# Patient Record
Sex: Male | Born: 2005 | Race: White | Hispanic: Yes | Marital: Single | Smoking: Never smoker
Health system: Southern US, Community
[De-identification: ages and names within clinical notes are randomized; demographics above are authoritative.]

---
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
## 2024-02-14 NOTE — Progress Notes (Signed)
 Almalik Weissberg is a 18 y.o.  with  reports that he has never smoked. He has never used smokeless tobacco. with  Active Ambulatory Problems    Diagnosis Date Noted  . Irritable bowel syndrome with constipation 12/19/2019  . Fecal impaction of colon (HCC) 05/17/2020  . Abnormal auditory perception of both ears 05/31/2020  . 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 No Resolved Ambulatory Problems   Past Medical History:  Diagnosis Date  . Bleeding nose   . Constipation    who presents today at Urgent Care for  Chief Complaint  Patient presents with  . Abdominal Pain    Pt complains of pubic pain and  penile discharge x 2 days  Pt wants to be tested for all STI testing including blood work.   . muffled hearing    Pt noticed his hearing has been muffled for the past 5 days        History of Present Illness This is a 18 year old male presenting with pubic pain and discharge from the penis for about 2 days. He would like STI testing. He also has noticed that his hearing is a little muffled for the past 5 days.  The patient reports a recent sexual encounter approximately 2 weeks ago, which has raised concerns about potential sexually transmitted infections. He describes a milky discharge from his penis and expresses concern about the possibility of gonorrhea or chlamydia. He also mentions an increase in his hearing threshold, which he attributes to sinus pressure. Additionally, he notes that his sexual activity has been more frequent recently, with multiple ejaculations, and wonders if this could have caused irritation leading to the presence of blood in his urine.  The patient reports muffled hearing for the past 5 days. He had a localized injury in his left ear a few years ago and is not concerned about it. He has noticed that his hearing threshold is a little bit higher. He feels as if he has more pressure in his ears,  which he initially thought could be sinus pressure. He is mildly concerned about it. He is hesitant about medical procedures. He once cleaned his ears out and got a little bit of blood, but that was way before this.      Patient has no co-morbidities  or medications that might increase the risk for developing a severe infection     reports that he has never smoked. He has never used smokeless tobacco.  Review of Systems  Review of systems is otherwise negative except as noted in the HPI and Assessment/MDM   Physical Exam  BP 123/68   Pulse 87   Temp 98.6 F (37 C) (Oral)   Resp 18   Ht 1.803 m (5' 11)   Wt 86.2 kg (190 lb)   SpO2 99%   BMI 26.50 kg/m   Constitutional:      General: Patient is not in acute distress.    Appearance: Normal appearance.  Neurological:     General: No focal deficit present.     Mental Status: alert and oriented to person, place, and time.  HENT:     Eyes:     Conjunctiva/sclera: Conjunctivae normal. Pharynx normal    There is no  associated anterior cervical lymphadenopathy    Pupils: Pupils are equal, round, and reactive to light.     TM's normal with no visible effusion  Cardiovascular:     Heart sounds:  Normal heart sounds. No murmur heard.    No Lower extremity edema noted Pulmonary:     Effort: Pulmonary effort is normal. No respiratory distress.     Breath sounds: No wheezing, rhonchi or rales.  Abdominal:     General: Bowel sounds are normal. There is no distension.     Tenderness: There is no abdominal tenderness.  Musculoskeletal:        General: Normal range of motion.     Neck:  No rigidity.  Skin:    General: Skin is warm and dry.  Psychiatric:        Mood and Affect: Mood normal.   No orders to display            DIAGNOSIS/PLAN     1. Otalgia of right ear  Ear Cerumen Removal   ciprofloxacin-dexAMETHasone (CIPRODEX) 0.3-0.1 % otic suspension    2. STI (sexually transmitted infection)  Chlamydia /  Gonococcus (GC), NAAT   Hepatitis C Virus (HCV) Antibody Screen With Confirmation   HIV Screen with Reflex to Confirmation   Rapid Plasma Reagin (RPR), Qualitative Test with Reflex to Titer and Confirmation   Trichomonas vaginalis, Qualitative NAAT   POC Urinalysis Auto without Microscopic      Assessment & Plan Initial Assessment: 18 year old male with pubic pain and penile discharge for 2 days, requesting STI testing. Reports muffled hearing for 5 days.  Differential Diagnosis: - Gonorrhea   - Milky discharge raises suspicion   - Ceftriaxone 500 mg injection discussed   - Advised to abstain from sexual intercourse until results available   - Opted to wait for test results before proceeding with ceftriaxone injection - Chlamydia   - Symptoms align with chlamydia   - Doxycycline prescribed, 100 mg twice daily for 7 days   - Advised to abstain from sexual intercourse until results available  ED Course: - Ear irrigation performed to remove hair - Prescription for eardrops provided  Final Assessment: STI testing requested due to pubic pain and penile discharge. Ear irrigation performed for hair causing inflammation. Prescriptions for doxycycline and eardrops provided.  Clinical Impression: - Suspected gonorrhea - Suspected chlamydia - Ear inflammation  Disposition: - Follow-Up: Return for ceftriaxone injection if test results confirm gonorrhea. Continue doxycycline if chlamydia confirmed.  Patient Education: Advised to abstain from sexual intercourse until STI test results are available. Discussed potential side effects of ceftriaxone and ear irrigation risks.  Portions of this note were created using the aid of voice recognition Dragon/DAX dictation software.   We discussed risks and side effects of medications, and also discussed red flags which would warrant immediate follow-up.   Urgent Care Disposition:  Home Care

## 2024-03-01 ENCOUNTER — Emergency Department (HOSPITAL_BASED_OUTPATIENT_CLINIC_OR_DEPARTMENT_OTHER)
Admission: EM | Admit: 2024-03-01 | Discharge: 2024-03-01 | Disposition: A | Discharge status: Home / Self Care | Attending: Emergency Medicine | Admitting: Emergency Medicine

## 2024-03-01 ENCOUNTER — Other Ambulatory Visit: Payer: Self-pay

## 2024-03-01 ENCOUNTER — Emergency Department (HOSPITAL_BASED_OUTPATIENT_CLINIC_OR_DEPARTMENT_OTHER)

## 2024-03-01 DIAGNOSIS — N503 Cyst of epididymis: Secondary | ICD-10-CM | POA: Insufficient documentation

## 2024-03-01 DIAGNOSIS — N50811 Right testicular pain: Secondary | ICD-10-CM | POA: Insufficient documentation

## 2024-03-01 LAB — URINALYSIS, ROUTINE W REFLEX MICROSCOPIC
Bilirubin Urine: NEGATIVE
Glucose, UA: NEGATIVE mg/dL
Hgb urine dipstick: NEGATIVE
Ketones, ur: NEGATIVE mg/dL
Leukocytes,Ua: NEGATIVE
Nitrite: NEGATIVE
Protein, ur: NEGATIVE mg/dL
Specific Gravity, Urine: 1.021 (ref 1.005–1.030)
pH: 6.5 (ref 5.0–8.0)

## 2024-03-01 NOTE — ED Triage Notes (Signed)
 Pt POV reporting R testicle discomfort and swelling since last night. Ran half marathon on Thanksgiving.

## 2024-03-01 NOTE — ED Notes (Signed)
Patient verbalizes understanding of discharge instructions. Opportunity for questioning and answers were provided. Armband removed by staff, pt discharged from ED. Ambulated out to lobby  

## 2024-03-01 NOTE — ED Notes (Signed)
 Pt c/o pain in the bladder when urinating, states that this has happened to him previously. Paramedic notified.

## 2024-03-01 NOTE — ED Notes (Signed)
 This author assisted EDP with testicular exam.

## 2024-03-01 NOTE — ED Provider Notes (Signed)
 [place] EMERGENCY DEPARTMENT AT [facility] Provider Note   CSN: [id number] Arrival date & time: 03/01/24  1816     Patient presents with: Testicle Pain   Brad Campos is a 18 y.o. male with no pertinent past medical history presents emergency department for evaluation of right testicular pain.  Patient reports he first noticed right testicular pain yesterday and states that it was more elevated than normal.  Patient reports the pain was also present today.  Patient was evaluated urgent care on 11/13 for the same complaint.  At urgent care, his urine was found to have trace amounts of blood.  His urine was otherwise not infected.  Primary differential at that time was GC/chlamydia and treatment with ceftriaxone and doxycycline was discussed, however patient did not have concern for STIs so was not treated.  Patient denies any fever, body aches, chills.  He denies abdominal pain, nausea or vomiting.  Patient reports his pain is localized to the right testicle and does not wrap around to his back or move elsewhere.  Testicle Pain       Prior to Admission medications   Medication Sig Start Date End Date Taking? Authorizing Provider  cephALEXin  (KEFLEX ) 500 MG capsule Take 1 capsule (500 mg total) by mouth 3 (three) times daily. 03/17/21   Gershon Donnice SAUNDERS, DPM  gentamicin  cream (GARAMYCIN ) 0.1 % Apply 1 application topically 2 (two) times daily. 02/09/21   Janit Thresa HERO, DPM  lactulose  (CHRONULAC ) 10 GM/15ML solution Take 30 mLs (20 g total) by mouth in the morning and at bedtime. 10/12/19   Petrucelli, Samantha R, PA-C    Allergies: Patient has no known allergies.    Review of Systems  Genitourinary:  Positive for testicular pain.    Updated Vital Signs BP 129/82   Pulse 76   Temp 97.7 F (36.5 C) (Oral)   Resp 17   Ht 5' 11 (1.803 m)   Wt 86.2 kg   SpO2 100%   BMI 26.50 kg/m   Physical Exam Vitals and nursing note reviewed. Exam conducted with a  chaperone present.  Constitutional:      Appearance: Normal appearance.  HENT:     Head: Normocephalic and atraumatic.     Mouth/Throat:     Mouth: Mucous membranes are moist.  Eyes:     General: No scleral icterus.       Right eye: No discharge.        Left eye: No discharge.     Conjunctiva/sclera: Conjunctivae normal.  Cardiovascular:     Rate and Rhythm: Normal rate and regular rhythm.     Pulses: Normal pulses.  Pulmonary:     Effort: Pulmonary effort is normal.     Breath sounds: Normal breath sounds.  Abdominal:     General: There is no distension.     Tenderness: There is no abdominal tenderness.  Genitourinary:    Penis: Normal and uncircumcised. No discharge.      Testes:        Right: Tenderness present.     Comments: Patient with epididymis tenderness to palpation on the right.  Left testicle is nontender.  Mild erythema noted to the right testicle.  No discharge noted to penile head. Musculoskeletal:        General: No deformity.     Cervical back: Normal range of motion.  Skin:    General: Skin is warm and dry.     Capillary Refill: Capillary refill takes less than 2 seconds.  Neurological:     Mental Status: He is alert.     Motor: No weakness.  Psychiatric:        Mood and Affect: Mood normal.     (all labs ordered are listed, but only abnormal results are displayed) Labs Reviewed  URINALYSIS, ROUTINE W REFLEX MICROSCOPIC  GC/CHLAMYDIA PROBE AMP ([place]) NOT AT [facility]    EKG: None  Radiology: US  SCROTUM W/DOPPLER Result Date: 03/01/2024 CLINICAL DATA:  Right testicle pain x1 day. EXAM: SCROTAL ULTRASOUND DOPPLER ULTRASOUND OF THE TESTICLES TECHNIQUE: Complete ultrasound examination of the testicles, epididymis, and other scrotal structures was performed. Color and spectral Doppler ultrasound were also utilized to evaluate blood flow to the testicles. COMPARISON:  None Available. FINDINGS: Right testicle Measurements: 4.3 cm x 2.4 cm x 2.8 cm. No  mass or microlithiasis visualized. Left testicle Measurements: 4.3 cm x 2.4 cm x 2.7 cm. No mass or microlithiasis visualized. Right epididymis: There is a 1.7 mm x 1.7 mm x 2.2 mm right epididymal head cyst. Left epididymis:  Normal in size and appearance. Hydrocele:  None visualized. Varicocele:  None visualized. Pulsed Doppler interrogation of both testes demonstrates normal low resistance arterial and venous waveforms bilaterally. IMPRESSION: Small right epididymal head cyst. Electronically Signed   By: Suzen Dials M.D.   On: 03/01/2024 19:28    Procedures   Medications Ordered in the ED - No data to display                                  Medical Decision Making Amount and/or Complexity of Data Reviewed Labs: ordered. Radiology: ordered.   This patient presents to the ED for concern of scrotal pain, this involves an extensive number of treatment options, and is a complaint that carries with it a high risk of complications and morbidity.   Differential diagnosis includes: Testicular torsion, GC/chlamydia, UTI, pyelonephritis, epididymitis  Co morbidities:  none  Additional history:  Patient was evaluated urgent care on 11/13 for complaints of abnormal penile discharge and scrotal pain.  His GC/chlamydia test were negative.  He was prescribed prophylactic doxycycline at that time.Unclear whether or not patient actually took this medication.  Lab Tests:  I Ordered, and personally interpreted labs.  The pertinent results include: No acute abnormalities to explain patient's symptoms.   Imaging Studies:  I ordered imaging studies including scrotal ultrasound I independently visualized and interpreted imaging which showed   Small right epididymal head cyst.   I agree with the radiologist interpretation  Cardiac Monitoring/ECG:  The patient was maintained on a cardiac monitor.  I personally viewed and interpreted the cardiac monitored which showed an underlying rhythm of:  Sinus rhythm  Medicines ordered and prescription drug management: I offered the patient pain medicine however he declined.  Test Considered:   none  Critical Interventions:   none  Consultations Obtained: None  Problem List / ED Course:     ICD-10-CM   1. Pain in right testicle  N50.811       MDM: 18 year old male who presents emergency department for evaluation of right testicular pain.  Patient reports the pain started yesterday and has worsened today.  He does report recent sexual activity with a male partner.  Patient was evaluated urgent care on 11/13 for same complaint.  At that time, his UA did show trace amounts of blood.  There was discussion with the urgent care provider about treating prophylactically for  GC/chlamydia as the patient also reported abnormal penile discharge at that time.  However, patient was not treated prophylactically for GC/chlamydia as his results came back negative and the provider opted not to treat him.  I did order a testicular ultrasound to rule out possible torsion.  Ultrasound shows small right epididymal cyst on the head.  This is possibly consistent with a small spermatocele.  Upon further discussion with the patient, he actually states he does not have any concerns for STIs at this time.  He is declining prophylactic treatment in the emergency department today as well.  Given patient's reassuring UA today, I am agreeable to this.  I did provide the patient with a referral to urology and recommended he call to schedule an appointment for further workup.  Patient verbalizes understanding to this and is agreeable.  Patient vital signs are stable.  Patient is appropriate for discharge at this time.  Dispostion:  After consideration of the diagnostic results and the patients response to treatment, I feel that the patient would benefit from supportive care.    Final diagnoses:  Pain in right testicle    ED Discharge Orders     None           Torrence Marry GORMAN DEVONNA 03/02/24 0001    Armenta Canning, MD 03/02/24 1850

## 2024-03-01 NOTE — ED Notes (Signed)
 Assisted US  with testicular exam.

## 2024-03-01 NOTE — Discharge Instructions (Addendum)
 It was a pleasure taking care of you today. You were seen in the Emergency Department for right testicular and epididymis pain. Your work-up was reassuring. Your ultrasound showed no evidence of testicular torsion.  There was mention of a small cyst on the epididymis, which could be consistent with something called a spermatocele.  However, urology would be better equipped to assess and diagnose this.  At this time, you have opted not to receive prophylactic treatment for GC/chlamydia.  I did however provide you with a referral to urology, who you can call to schedule an appointment with if your symptoms worsen. Refer to the attached documentation for further management of your symptoms.   Please return to the ER if you experience chest pain, trouble breathing, intractable nausea/vomiting or any other life threatening illnesses.

## 2024-03-03 LAB — GC/CHLAMYDIA PROBE AMP (~~LOC~~) NOT AT ARMC
Chlamydia: NEGATIVE
Comment: NEGATIVE
Comment: NORMAL
Neisseria Gonorrhea: NEGATIVE

## 2024-04-01 ENCOUNTER — Encounter (HOSPITAL_BASED_OUTPATIENT_CLINIC_OR_DEPARTMENT_OTHER): Payer: Self-pay

## 2024-04-01 ENCOUNTER — Emergency Department (HOSPITAL_BASED_OUTPATIENT_CLINIC_OR_DEPARTMENT_OTHER)

## 2024-04-01 ENCOUNTER — Emergency Department (HOSPITAL_BASED_OUTPATIENT_CLINIC_OR_DEPARTMENT_OTHER)
Admission: EM | Admit: 2024-04-01 | Discharge: 2024-04-01 | Disposition: A | Discharge status: Home / Self Care | Source: Ambulatory Visit

## 2024-04-01 ENCOUNTER — Other Ambulatory Visit: Payer: Self-pay

## 2024-04-01 DIAGNOSIS — R519 Headache, unspecified: Secondary | ICD-10-CM | POA: Diagnosis present

## 2024-04-01 DIAGNOSIS — R4189 Other symptoms and signs involving cognitive functions and awareness: Secondary | ICD-10-CM

## 2024-04-01 DIAGNOSIS — G9389 Other specified disorders of brain: Secondary | ICD-10-CM | POA: Insufficient documentation

## 2024-04-01 MED ORDER — HYDROXYZINE HCL 25 MG PO TABS: 25.0000 mg | ORAL_TABLET | Freq: Three times a day (TID) | ORAL | 0 refills | Status: AC | PRN

## 2024-04-01 NOTE — Progress Notes (Signed)
 SUBJECTIVE:  Brad Campos is a 18 y.o. male with girlfriend.  History of Present Illness This is a male with a history of bronchitis presenting with a headache.  The patient has been experiencing a persistent headache since 03/29/2024. He describes the headache as migratory, affecting various parts of his head, including his nose, the top of his head, and the back of his head. He rates the pain as 4 to 6 out of 10, escalating to an 8 at its peak. The headache has been severe enough to disrupt his cognitive functions, including thinking, speaking, and memory recall. He reports a sensation of heat in his head since 03/31/2024 but did not measure his temperature. He typically does not take medication and considers himself generally healthy. He recalls a similar episode during a previous trip involving altitude changes, which he attributed to the change in elevation. His sleep pattern has been irregular, but it is gradually improving. He reports no numbness or tingling in his extremities, except for facial numbness earlier. He reports no weakness in his arms or legs. He reports no feelings of depression, worthlessness, or suicidal ideation. He has been experiencing a lack of appetite and thirst.  The patient also reports episodes of confusion and brain fog, describing a sensation of detachment from his surroundings. He experienced difficulty swallowing [place] the beach on [place]2024 and had a nosebleed, which he attributes to sun exposure. He has been feeling shaky since his arrival at the clinic and experienced a sensation of the floor moving beneath his feet while in the lobby. He reports no dizziness or blurry vision but mentions difficulty comprehending written text. He reports muffled hearing and right ear pain this morning. He reports no runny nose, cough, vomiting, or diarrhea. He reports no current chest pain. Has had chest pain in the past and states was told it was bronchitis.  The  patient has a history of bronchitis, which causes chest pain and difficulty breathing, particularly with temperature changes. He describes the pain as being located around his diaphragm and ribs. 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 He has previously undergone an x-ray for this issue.  Parts of patient history reviewed include PMH, problem list, medications, allergies, social history, family history, and surgical history.   Behavioral Health Screening  Patient Health Questionnaire-2 Score: 0 (04/01/2024  2:07 PM)      Patient's Depression screening is Negative   OBJECTIVE:  Vitals:   04/01/24 1402 04/01/24 1408  BP: 144/82 138/82  Pulse: 97 98  Resp: 18   Temp: 98.3 F (36.8 C)   TempSrc: Tympanic   SpO2: 100%   Weight: 92 kg (202 lb 12.8 oz)   Height: 1.803 m (5' 11)      General: Patient is well-nourished and in no acute distress.  Skin:  No rash noted. Head:  Normocephalic, atraumatic.   Eyes: EOM intact Ears:  Gross hearing intact.  Canals without erythema or swelling.  TMs pearly bilaterally. Mouth/pharynx: Gingiva and mucosa pink.  No erythema or exudate bilaterally.   Neck: NROM Lungs: CTA bilaterally.  Normal effort. Heart: NRR with no murmurs, rubs, or gallops.   Neurologic: Alert and oriented. CN 2-12 intact.  Speech is clear and goal oriented    ASSESSMENT:   1. Acute nonintractable headache, unspecified headache type        PLAN:  Assessment & Plan Initial Assessment: The patient presents with a sudden  onset of headaches, episodes of confusion, difficulty articulating thoughts, and brain fog since Saturday. He also reports muffled hearing, ear pain, and a history of bronchitis causing chest pain and difficulty breathing.  Non-specific symptoms, but due to new headache he has never had before, episode of confusion, episode of facial tingling, and difficulty articulating thoughts  recommend further evaluation at ER. - Neurological conditions: New onset of confusion and difficulty finding words. Referral to the emergency room for further evaluation and potential imaging studies.  Non-specific chest pain he states he feels with deep breaths and also with swallowing sometimes he has had for 3 months. None now. Seems unlikely cardiac, but has not had EKG on our record so will assess.  EKG Normal sinus rhythm with sinus arrhythmia, right axis deviation, incomplete RBBB, RVH.  May mention to ED although does not fully explain headache, intermittent confusion, and sometimes having difficulty articulating thoughts. If no further evaluation by ER would recommend evaluation by PCP to consider echocardiogram.  Blood glucose 130  Patient is currently stable.  Recommended EMS as safest option. Patient prefers girlfriend drive him.  Left in no acute distress to ER  Electronically signed by: Donnice Elspeth Seip, PA-C 04/01/2024 2:30 PM

## 2024-04-01 NOTE — ED Provider Notes (Signed)
 " [place] EMERGENCY DEPARTMENT AT [facility] Provider Note   CSN: [id number] Arrival date & time: 04/01/24  1522     Patient presents with: Headache   Mical Kicklighter is a 18 y.o. male.   18 year old male presents for evaluation of headache.  Was sent in by urgent care for CT head.  Has had a headache off and on for a few days and had an episode of brain fog today.  States he has been under more stress than usual and noticed some difficulty swallowing earlier today that has improved.  Denies any other symptoms or concerns.   Headache Associated symptoms: no abdominal pain, no back pain, no cough, no ear pain, no eye pain, no fever, no seizures, no sore throat and no vomiting        Prior to Admission medications  Medication Sig Start Date End Date Taking? Authorizing Provider  hydrOXYzine (ATARAX) 25 MG tablet Take 1 tablet (25 mg total) by mouth every 8 (eight) hours as needed for anxiety. 04/01/24  Yes Kamilah Correia L, DO  cephALEXin  (KEFLEX ) 500 MG capsule Take 1 capsule (500 mg total) by mouth 3 (three) times daily. 03/17/21   Gershon Donnice SAUNDERS, DPM  gentamicin  cream (GARAMYCIN ) 0.1 % Apply 1 application topically 2 (two) times daily. 02/09/21   Janit Thresa HERO, DPM  lactulose  (CHRONULAC ) 10 GM/15ML solution Take 30 mLs (20 g total) by mouth in the morning and at bedtime. 10/12/19   Petrucelli, Samantha R, PA-C    Allergies: Patient has no known allergies.    Review of Systems  Constitutional:  Negative for chills and fever.  HENT:  Negative for ear pain and sore throat.   Eyes:  Negative for pain and visual disturbance.  Respiratory:  Negative for cough and shortness of breath.   Cardiovascular:  Negative for chest pain and palpitations.  Gastrointestinal:  Negative for abdominal pain and vomiting.  Genitourinary:  Negative for dysuria and hematuria.  Musculoskeletal:  Negative for arthralgias and back pain.  Skin:  Negative for color change and rash.   Neurological:  Positive for headaches. Negative for seizures and syncope.  All other systems reviewed and are negative.   Updated Vital Signs BP (!) 146/80 (BP Location: Right Arm)   Pulse 99   Temp 98.3 F (36.8 C) (Oral)   Resp 18   Ht 5' 11 (1.803 m)   Wt 86 kg   SpO2 100%   BMI 26.44 kg/m   Physical Exam Vitals and nursing note reviewed.  Constitutional:      General: He is not in acute distress.    Appearance: He is well-developed. He is not ill-appearing.  HENT:     Head: Normocephalic and atraumatic.  Eyes:     Conjunctiva/sclera: Conjunctivae normal.  Cardiovascular:     Rate and Rhythm: Normal rate and regular rhythm.     Heart sounds: No murmur heard. Pulmonary:     Effort: Pulmonary effort is normal. No respiratory distress.     Breath sounds: Normal breath sounds.  Abdominal:     Palpations: Abdomen is soft.     Tenderness: There is no abdominal tenderness.  Musculoskeletal:        General: No swelling.     Cervical back: Neck supple.  Skin:    General: Skin is warm and dry.     Capillary Refill: Capillary refill takes less than 2 seconds.  Neurological:     Mental Status: He is alert. Mental status is at  baseline.     GCS: GCS eye subscore is 4. GCS verbal subscore is 5. GCS motor subscore is 6.     Cranial Nerves: No cranial nerve deficit.     Sensory: No sensory deficit.  Psychiatric:        Mood and Affect: Mood normal.     (all labs ordered are listed, but only abnormal results are displayed) Labs Reviewed - No data to display  EKG: None  Radiology: CT Head Wo Contrast Result Date: 04/01/2024 EXAM: CT HEAD WITHOUT CONTRAST 04/01/2024 05:37:14 PM TECHNIQUE: CT of the head was performed without the administration of intravenous contrast. Automated exposure control, iterative reconstruction, and/or weight based adjustment of the mA/kV was utilized to reduce the radiation dose to as low as reasonably achievable. COMPARISON: None available.  CLINICAL HISTORY: headache, sent for CT scan FINDINGS: BRAIN AND VENTRICLES: No acute hemorrhage. No evidence of acute infarct. No hydrocephalus. No extra-axial collection. No mass effect or midline shift. ORBITS: No acute abnormality. SINUSES: No acute abnormality. SOFT TISSUES AND SKULL: No acute soft tissue abnormality. No skull fracture. IMPRESSION: 1. No acute intracranial abnormality. Electronically signed by: Donnice Mania MD 04/01/2024 06:41 PM EST RP Workstation: [id number]     Procedures   Medications Ordered in the ED - No data to display                                  Medical Decision Making Patient here for headache and brain fog that is improved.  Will give prescription for Atarax as he has been under more stress and some of his symptoms is that he may be getting panic attacks.  CT head negative.  Advised Tylenol Motrin  as needed for pain and close up with primary care.  Advised otherwise return to the ER for new or worsening symptoms.  He feels comfortable being discharged home.  Problems Addressed: Brain fog: self-limited or minor problem Nonintractable headache, unspecified chronicity pattern, unspecified headache type: acute illness or injury  Amount and/or Complexity of Data Reviewed External Data Reviewed: notes.    Details: Urgent care records reviewed and patient was sent to the ED for CT head  Radiology: ordered and independent interpretation performed. Decision-making details documented in ED Course.    Details: Ordered and interpreted by me independently of radiology CT head: Shows no acute abnormality  Risk OTC drugs. Prescription drug management.     Final diagnoses:  Nonintractable headache, unspecified chronicity pattern, unspecified headache type  Brain fog    ED Discharge Orders          Ordered    hydrOXYzine (ATARAX) 25 MG tablet  Every 8 hours PRN        04/01/24 2024               Sabriya Yono L, DO 04/01/24 2053  "

## 2024-04-01 NOTE — Discharge Instructions (Signed)
 Take your Atarax as needed for anxiety.  You can use Tylenol Motrin  as needed for pain.  Follow-up with your primary care doctor in 1 to 2 weeks to further discuss your symptoms.

## 2024-04-01 NOTE — ED Triage Notes (Signed)
 Patient reports headache since 03/29/24. Reports episodes of confusion and brain fog today. Sent over for head CT. Also reports some difficulty swallowing for several months.
